# Patient Record
Sex: Male | Born: 1975 | Race: White | Hispanic: No | Marital: Married | Smoking: Current every day smoker
Health system: Southern US, Community
[De-identification: ages and names within clinical notes are randomized; demographics above are authoritative.]

---

## 2003-09-09 ENCOUNTER — Emergency Department (HOSPITAL_COMMUNITY): Admission: EM | Admit: 2003-09-09 | Discharge: 2003-09-09 | Payer: Self-pay | Admitting: Emergency Medicine

## 2003-09-18 ENCOUNTER — Emergency Department (HOSPITAL_COMMUNITY): Admission: AD | Admit: 2003-09-18 | Discharge: 2003-09-18 | Payer: Self-pay | Admitting: Family Medicine

## 2006-10-20 ENCOUNTER — Ambulatory Visit (HOSPITAL_COMMUNITY): Admission: RE | Admit: 2006-10-20 | Discharge: 2006-10-20 | Payer: Self-pay | Admitting: Family Medicine

## 2006-10-26 ENCOUNTER — Ambulatory Visit (HOSPITAL_COMMUNITY): Admission: RE | Admit: 2006-10-26 | Discharge: 2006-10-26 | Payer: Self-pay | Admitting: Family Medicine

## 2006-10-31 ENCOUNTER — Encounter (HOSPITAL_COMMUNITY): Admission: RE | Admit: 2006-10-31 | Discharge: 2006-11-30 | Payer: Self-pay | Admitting: Family Medicine

## 2006-11-17 ENCOUNTER — Ambulatory Visit (HOSPITAL_COMMUNITY): Admission: RE | Admit: 2006-11-17 | Discharge: 2006-11-17 | Payer: Self-pay | Admitting: General Surgery

## 2006-11-17 ENCOUNTER — Encounter (INDEPENDENT_AMBULATORY_CARE_PROVIDER_SITE_OTHER): Payer: Self-pay | Admitting: *Deleted

## 2008-09-02 ENCOUNTER — Ambulatory Visit (HOSPITAL_COMMUNITY): Admission: RE | Admit: 2008-09-02 | Discharge: 2008-09-02 | Payer: Self-pay | Admitting: Family Medicine

## 2010-08-11 IMAGING — CR DG CHEST 2V
2 series · 2 of 2 positions shown · non-contrast
Comparison: None

CLINICAL DATA: Persistent cough, congestion, shortness of breath,
history smoking

CHEST - 2 VIEW

[view not recorded (1 of 2)]
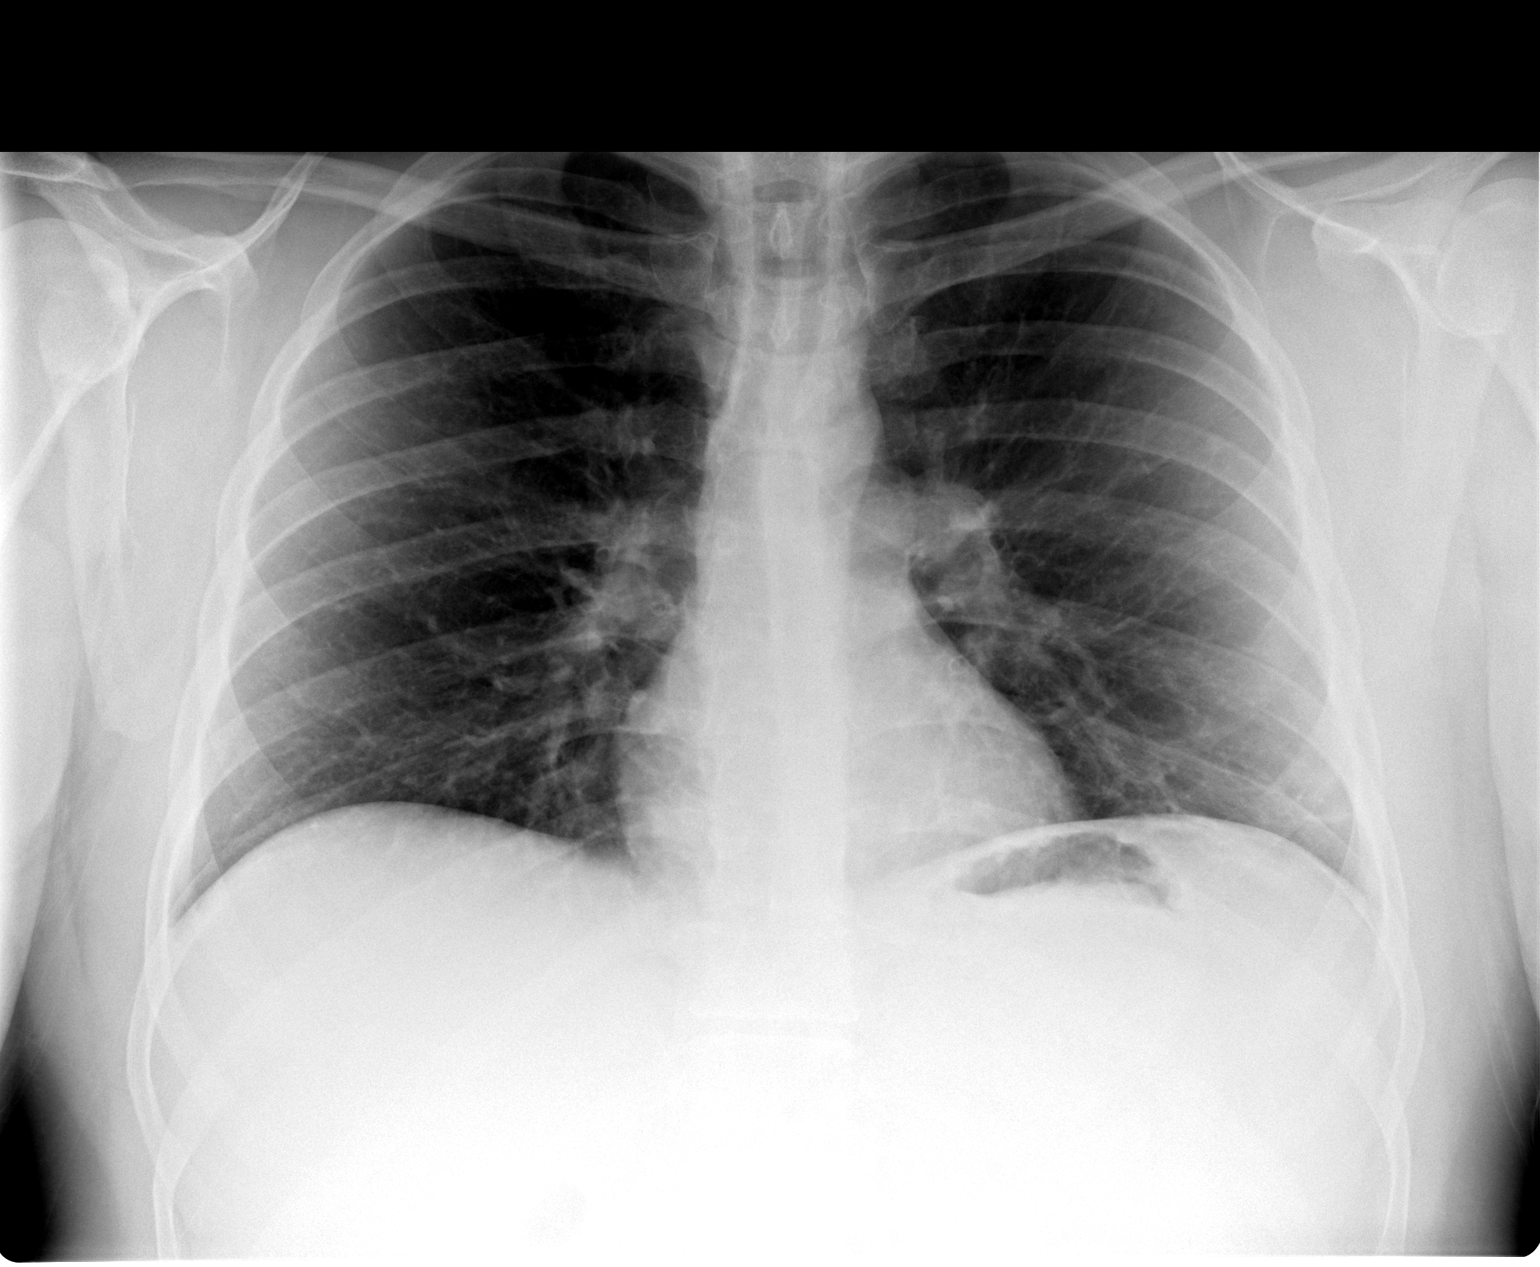

[view not recorded (2 of 2)]
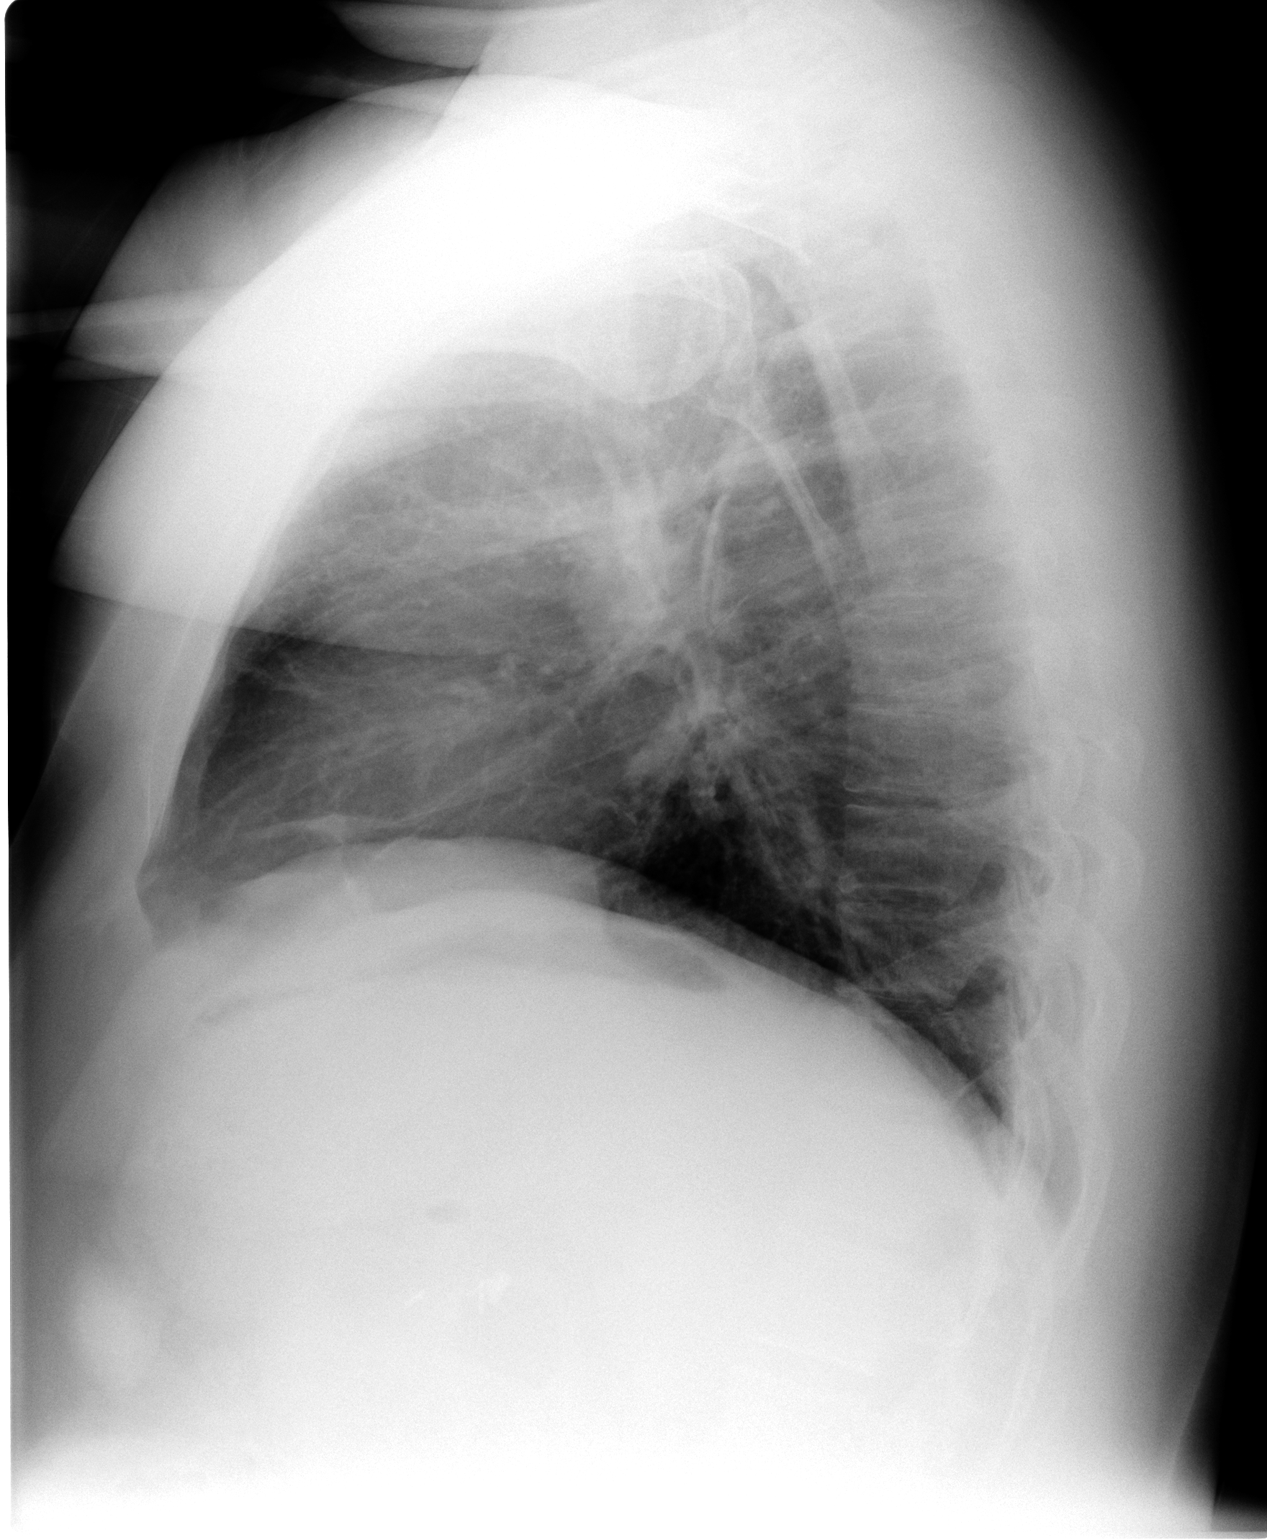

[2 of 2 positions shown; findings below may reference images not displayed]

FINDINGS: Normal heart size, mediastinal contours, and pulmonary vascularity.
Mild peribronchial thickening.
No pulmonary infiltrate, pleural effusion, or pneumothorax.
Bones unremarkable.
Minimal subsegmental atelectasis versus scarring lateral left lung
base.
IMPRESSION: Bronchitic changes.

## 2011-01-14 NOTE — Op Note (Signed)
Melvin Lambert, Melvin Lambert             ACCOUNT NO.:  [id number]   MEDICAL RECORD NO.:  [id number]          PATIENT TYPE:  AMB   LOCATION:  DAY                           FACILITY:  [facility]   PHYSICIAN:  Dalia Heading, M.D.  DATE OF BIRTH:  October 25, 1975   DATE OF PROCEDURE:  11/17/2006  DATE OF DISCHARGE:                               OPERATIVE REPORT   PREOPERATIVE DIAGNOSIS:  Chronic cholecystitis.   POSTOPERATIVE DIAGNOSIS:  Chronic cholecystitis.   PROCEDURE:  Laparoscopic cholecystectomy.   SURGEON:  Dr. Franky Macho   ANESTHESIA:  General endotracheal.   INDICATIONS:  The patient is a 35 year old white male who presents with  chronic cholecystitis.  The risks and benefits of the procedure,  including bleeding, infection, hepatobiliary injury, and the possibility  of an open procedure, were fully explained to the patient, who gave  informed consent.   PROCEDURE NOTE:  The patient was placed in supine position.  After  induction of general endotracheal anesthesia, the abdomen was prepped  and draped using usual sterile technique with Betadine.  Surgical site  confirmation was performed.   A supraumbilical incision was made down to the fascia.  A Veress needle  was introduced into the abdominal cavity and confirmation of placement  was done using the saline drop test.  The abdomen was then insufflated  to 16 mmHg pressure.  An 11-mm trocar was introduced into the abdominal  cavity, under direct visualization, without difficulty.  The patient was  placed in reversed Trendelenburg position.  An additional 11-mm trocar  was placed in the epigastric region and 5-mm trocars placed in right  upper quadrant and right flank regions.  The liver was inspected and  noted to be within normal limits.  The gallbladder was retracted  superior and laterally.  The dissection was begun around the  infundibulum of the gallbladder.  The cystic duct was first identified.  Its juncture to the  infundibulum fully identified.  Endoclips were  placed proximally distally on the cystic duct and the cystic duct was  divided.  This was likewise done on the cystic artery.  The gallbladder  was then freed away from gallbladder fossa, using Bovie electrocautery.  The gallbladder was delivered through the epigastric trocar site, using  an EndoCatch bag.  The gallbladder fossa was inspected and no abnormal  bleeding or bile leakage was noted.  Surgicel was placed in the  gallbladder fossa.  All fluid and air were then evacuate from the  abdominal cavity prior to removal of the trocars.   All wounds were irrigated with normal saline.  All wounds were injected  with 0.5% Sensorcaine.  The supraumbilical fascia was reapproximated  using an 0 Vicryl interrupted suture.  All skin incisions were closed  using staples.  Betadine ointment and dry sterile dressings were  applied.   All tape and needle counts were correct at the end of the procedure.  The patient was extubated in the operating room and went back to the  recovery room, awake in stable condition.   COMPLICATIONS:  None.   SPECIMEN:  Gallbladder.   BLOOD LOSS:  Minimal.      Dalia Heading, M.D.  Electronically Signed     MAJ/MEDQ  D:  11/17/2006  T:  11/17/2006  Job:  [id number]   cc:   Corrie Mckusick, M.D.  Fax: [phone]

## 2011-01-14 NOTE — H&P (Signed)
Melvin Lambert, Melvin Lambert             ACCOUNT NO.:  [id number]   MEDICAL RECORD NO.:  [id number]          PATIENT TYPE:  AMB   LOCATION:  DAY                           FACILITY:  [facility]   PHYSICIAN:  Dalia Heading, M.D.  DATE OF BIRTH:  09-27-1975   DATE OF ADMISSION:  11/14/2006  DATE OF DISCHARGE:  LH                              HISTORY & PHYSICAL   CHIEF COMPLAINT:  Chronic cholecystitis.   HISTORY OF PRESENT ILLNESS:  The patient is a 35 year old white male who  is referred for evaluation and treatment biliary colic secondary to  chronic cholecystitis.  He has been having right upper quadrant  abdominal pain with nausea and bloating for many weeks.  Does have fatty  food intolerance.  No fever, chills, jaundice have been noted.   PAST MEDICAL HISTORY:  Unremarkable.   PAST SURGICAL HISTORY:  Unremarkable.   CURRENT MEDICATIONS:  None.   ALLERGIES:  NO KNOWN DRUG ALLERGIES.   REVIEW OF SYSTEMS:  The patient smokes less than half pack cigarettes a  day.  Denies any significant alcohol use.  He denies any other  cardiopulmonary difficulties or bleeding disorders.   PHYSICAL EXAMINATION:  GENERAL:  The patient is a well-developed, well-  nourished white male in no acute distress.  HEENT:  Examination reveals no scleral icterus.  LUNGS:  Clear to auscultation with equal breath sounds bilaterally.  HEART:  Examination reveals regular rate and rhythm without history, S4,  murmurs.  ABDOMEN:  Soft and nondistended.  He is tender in the right upper  quadrant to palpation.  No hepatosplenomegaly, masses, hernias are  identified.   STUDIES:  A HIDA scan reveals chronic cholecystitis with a low  gallbladder ejection fraction.   IMPRESSION:  Chronic cholecystitis.   PLAN:  The patient is scheduled for laparoscopic cholecystectomy on  November 17, 2006.  The risks and benefits of the procedure including  bleeding, infection, hepatobiliary injury, the possibility of an open  procedure  were fully explained to the patient, gave informed consent.      Dalia Heading, M.D.  Electronically Signed     MAJ/MEDQ  D:  11/14/2006  T:  11/15/2006  Job:  [id number]   cc:   Short Stay at [facility]   Corrie Mckusick, M.D.  Fax: [phone]

## 2018-08-02 ENCOUNTER — Telehealth: Payer: Self-pay | Admitting: Internal Medicine

## 2018-08-02 NOTE — Telephone Encounter (Signed)
Called pt and no answer was received and unable to leave voicemail.

## 2018-08-02 NOTE — Telephone Encounter (Signed)
Per patient his Rx was sent to incorrect Pharmacy.  Patient requests all medications should [facility] in [place]. This is the location at the corner of [address]

## 2019-08-05 ENCOUNTER — Ambulatory Visit (INDEPENDENT_AMBULATORY_CARE_PROVIDER_SITE_OTHER): Payer: 59 | Admitting: Orthopedic Surgery

## 2019-08-05 ENCOUNTER — Other Ambulatory Visit: Payer: Self-pay

## 2019-08-05 ENCOUNTER — Ambulatory Visit (INDEPENDENT_AMBULATORY_CARE_PROVIDER_SITE_OTHER): Payer: 59

## 2019-08-05 ENCOUNTER — Encounter: Payer: Self-pay | Admitting: Orthopedic Surgery

## 2019-08-05 VITALS — Ht 64.0 in | Wt 245.0 lb

## 2019-08-05 DIAGNOSIS — M79671 Pain in right foot: Secondary | ICD-10-CM

## 2019-08-05 NOTE — Progress Notes (Signed)
Office Visit Note   Patient: Melvin Lambert           Date of Birth: 28-Apr-1976           MRN: [id number] Visit Date: 08/05/2019              Requested by: No referring provider defined for this encounter. PCP: System, Pcp Not In  Chief Complaint  Patient presents with  . Right Ankle - Pain, New Patient (Initial Visit)  . Right Foot - Pain, New Patient (Initial Visit)      HPI: This is a pleasant 43 year old gentleman who is seen today for the evaluation of right ankle and foot pain he states that he stepped into a hole about 7 weeks ago and had significant ankle pain he went to an urgent care where x-rays were done and he did not have any fractures.  While the ankle has improved he now has pain across the dorsum of his foot which he describes as an aching and pain in the back of the heel.  He says honestly the ankle is not an issue at this point  Assessment & Plan: Visit Diagnoses:  1. Right foot pain     Plan: He will begin Achilles stretching and I have demonstrated this to him today 3 times a day we also discussed fascial strengthening and proprioceptive exercises .  He does have difficulty getting into the room that he has to wear for work because of the pain on the back of the heel and I have told him to try some Voltaren gel he will follow-up in 3 weeks  Follow-Up Instructions: No follow-ups on file.   Ortho Exam  Patient is alert, oriented, no adenopathy, well-dressed, normal affect, normal respiratory effort. Right ankle: No effusion mild soft tissue swelling he is nontender to palpation over the ankle joint both medially and laterally his pulses are intact he does have some mild tenderness to deep palpation over the dorsum of his foot plantarflexion with resistance recreates the pain in his posterior heel the Achilles is intact  Imaging: No results found. No images are attached to the encounter.  Labs: No results found for: HGBA1C, ESRSEDRATE, CRP, LABURIC,  REPTSTATUS, GRAMSTAIN, CULT, LABORGA   No results found for: ALBUMIN, PREALBUMIN, LABURIC  No results found for: MG No results found for: VD25OH  No results found for: PREALBUMIN No flowsheet data found.   Body mass index is 42.05 kg/m.  Orders:  Orders Placed This Encounter  Procedures  . XR Ankle 2 Views Right  . XR Foot 2 Views Right   No orders of the defined types were placed in this encounter.    Procedures: No procedures performed  Clinical Data: No additional findings.  ROS:  All other systems negative, except as noted in the HPI. Review of Systems  Objective: Vital Signs: Ht 5\' 4"  (1.626 m)   Wt 245 lb (111.1 kg)   BMI 42.05 kg/m   Specialty Comments:  No specialty comments available.  PMFS History: There are no active problems to display for this patient.  No past medical history on file.  No family history on file.   Social History   Occupational History  . Not on file  Tobacco Use  . Smoking status: Never Smoker  . Smokeless tobacco: Never Used  Substance and Sexual Activity  . Alcohol use: Never    Frequency: Never  . Drug use: Never  . Sexual activity: Not on file

## 2022-07-22 ENCOUNTER — Ambulatory Visit
Admission: EM | Admit: 2022-07-22 | Discharge: 2022-07-22 | Disposition: A | Discharge status: Home / Self Care | Payer: No Typology Code available for payment source | Attending: Urgent Care | Admitting: Urgent Care

## 2022-07-22 ENCOUNTER — Ambulatory Visit (INDEPENDENT_AMBULATORY_CARE_PROVIDER_SITE_OTHER): Payer: No Typology Code available for payment source

## 2022-07-22 DIAGNOSIS — M25511 Pain in right shoulder: Secondary | ICD-10-CM | POA: Diagnosis not present

## 2022-07-22 DIAGNOSIS — S46911A Strain of unspecified muscle, fascia and tendon at shoulder and upper arm level, right arm, initial encounter: Secondary | ICD-10-CM | POA: Diagnosis not present

## 2022-07-22 MED ORDER — IBUPROFEN 600 MG PO TABS: 600.0000 mg | ORAL_TABLET | Freq: Four times a day (QID) | ORAL | 0 refills | Status: AC | PRN

## 2022-07-22 MED ORDER — TIZANIDINE HCL 4 MG PO TABS: 4.0000 mg | ORAL_TABLET | Freq: Every day | ORAL | 0 refills | Status: AC

## 2022-07-22 NOTE — ED Provider Notes (Signed)
[facility]  Note:  This document was prepared using Conservation officer, historic buildings and may include unintentional dictation errors.  MRN: [id number] DOB: May 15, 1976  Subjective:   Melvin Lambert is a 46 y.o. male presenting for 1 day history of acute onset persistent right shoulder pain, decreased range of motion, popping.  Symptoms started after he was playing badminton and reached out during the specific play.  Since then he has had this particular symptoms.  No history of shoulder injuries.  Would like to get an x-ray done.  Does not have an orthopedist.  No current facility-administered medications for this encounter. No current outpatient medications on file.   Allergies  Allergen Reactions   Omnicef [Cefdinir] Hives    Patient states he is allergic to this medication in which causes hives.    History reviewed. No pertinent past medical history.   History reviewed. No pertinent surgical history.  No family history on file.  Social History   Tobacco Use   Smoking status: Every Day    Types: Cigarettes   Smokeless tobacco: Never  Substance Use Topics   Alcohol use: Never   Drug use: Never    ROS   Objective:   Vitals: BP 134/89 (BP Location: Left Arm)   Pulse 70   Temp 98.2 F (36.8 C) (Oral)   Resp 16   SpO2 96%   Physical Exam Constitutional:      General: He is not in acute distress.    Appearance: Normal appearance. He is well-developed and normal weight. He is not ill-appearing, toxic-appearing or diaphoretic.  HENT:     Head: Normocephalic and atraumatic.     Right Ear: External ear normal.     Left Ear: External ear normal.     Nose: Nose normal.     Mouth/Throat:     Pharynx: Oropharynx is clear.  Eyes:     General: No scleral icterus.       Right eye: No discharge.        Left eye: No discharge.     Extraocular Movements: Extraocular movements intact.  Cardiovascular:     Rate and Rhythm: Normal rate.   Pulmonary:     Effort: Pulmonary effort is normal.  Musculoskeletal:     Right shoulder: Tenderness (over area outlined) present. No swelling, deformity, effusion, laceration, bony tenderness or crepitus. Decreased range of motion. Normal strength. Normal pulse.       Arms:     Cervical back: Normal range of motion.     Comments: Biceps tendon intact, no ecchymosis, very symmetric to the left arm.  Neurological:     Mental Status: He is alert and oriented to person, place, and time.  Psychiatric:        Mood and Affect: Mood normal.        Behavior: Behavior normal.        Thought Content: Thought content normal.        Judgment: Judgment normal.     Assessment and Plan :   PDMP not reviewed this encounter.  1. Shoulder strain, right, initial encounter   2. Pain in joint of right shoulder     X-ray over-read was pending at time of discharge, recommended follow up with only abnormal results. Otherwise will not call for negative over-read. Patient was in agreement. Will manage for right shoulder strain with resting, icing, limiting his range of motion within reason.  No use of shoulder sling.  Recommended ibuprofen and  tizanidine.  Follow-up with an orthopedist.  Counseled patient on potential for adverse effects with medications prescribed/recommended today, ER and return-to-clinic precautions discussed, patient verbalized understanding.   We will call results to [phone].   Wallis Bamberg, PA-C 07/22/22 1435

## 2022-07-22 NOTE — ED Triage Notes (Signed)
Pt c/o pain to right shoulder after reaching while playing badminton yesterday-NAD-steady gait

## 2022-07-29 ENCOUNTER — Ambulatory Visit: Payer: No Typology Code available for payment source | Admitting: Physician Assistant

## 2022-09-19 ENCOUNTER — Encounter: Payer: Self-pay | Admitting: Physician Assistant

## 2022-09-19 ENCOUNTER — Ambulatory Visit (INDEPENDENT_AMBULATORY_CARE_PROVIDER_SITE_OTHER): Payer: No Typology Code available for payment source | Admitting: Physician Assistant

## 2022-09-19 DIAGNOSIS — M25511 Pain in right shoulder: Secondary | ICD-10-CM | POA: Diagnosis not present

## 2022-09-19 MED ORDER — LIDOCAINE HCL 1 % IJ SOLN
2.0000 mL | INTRAMUSCULAR | Status: AC | PRN
  Administered 2022-09-19: 2 mL

## 2022-09-19 MED ORDER — METHYLPREDNISOLONE ACETATE 40 MG/ML IJ SUSP
80.0000 mg | INTRAMUSCULAR | Status: AC | PRN
  Administered 2022-09-19: 80 mg via INTRA_ARTICULAR

## 2022-09-19 MED ORDER — BUPIVACAINE HCL 0.25 % IJ SOLN
2.0000 mL | INTRAMUSCULAR | Status: AC | PRN
  Administered 2022-09-19: 2 mL via INTRA_ARTICULAR

## 2022-09-19 NOTE — Progress Notes (Signed)
Office Visit Note   Patient: Melvin Lambert           Date of Birth: November 04, 1975           MRN: [id number] Visit Date: 09/19/2022              Requested by: No referring provider defined for this encounter. PCP: Pcp, No  Chief Complaint  Patient presents with  . Right Shoulder - Pain      HPI: Kirk a pleasant 47 year old gentleman with a 40-month history of right shoulder pain.  He states this began after he was playing badminton and attempted to hit and miss the badminton caulk.  He felt like he had a pop.  Since then he has 3-4 episodes a day of having painful clicking in his shoulder.  Does not really take anything for it.  Assessment & Plan: Visit Diagnoses: Right shoulder pain  Plan: His exam is fairly benign today he has good strength and good motion.  Cannot reproduce his painful symptoms.  Thoughts are some rotator cuff irritation though given his age could also have a labral pathology.  Will go forward with an injection today subacromial see if this helps him.  Can also do some rotator cuff exercises which I gave him.  I would give this another 4 weeks.  If he is not better he will contact me and we can order an MRI of the shoulder  Follow-Up Instructions: Return in about 4 weeks (around 10/17/2022).   Ortho Exam  Patient is alert, oriented, no adenopathy, well-dressed, normal affect, normal respiratory effort. Examination he has full forward elevation both actively and passively slight decrease in internal rotation behind his back compared to the unaffected side.  He is neurovascular intact grip strength is intact pulses are intact.  He has a negative apprehension sign.  Negative empty can test negative speeds sign.  Strength is 5 out of 5 with resisted abduction external and internal rotation  Imaging: No results found. No images are attached to the encounter.  Labs: No results found for: "HGBA1C", "ESRSEDRATE", "CRP", "LABURIC", "REPTSTATUS", "GRAMSTAIN", "CULT",  "LABORGA"   No results found for: "ALBUMIN", "PREALBUMIN", "CBC"  No results found for: "MG" No results found for: "VD25OH"  No results found for: "PREALBUMIN"     No data to display           There is no height or weight on file to calculate BMI.  Orders:  No orders of the defined types were placed in this encounter.  No orders of the defined types were placed in this encounter.    Procedures: Large Joint Inj: R subacromial bursa on 09/19/2022 4:28 PM Indications: diagnostic evaluation and pain Details: 25 G 1.5 in needle, posterior approach  Arthrogram: No  Medications: 2 mL lidocaine 1 %; 80 mg methylPREDNISolone acetate 40 MG/ML; 2 mL bupivacaine 0.25 % Outcome: tolerated well, no immediate complications Procedure, treatment alternatives, risks and benefits explained, specific risks discussed. Consent was given by the patient.    Clinical Data: No additional findings.  ROS:  All other systems negative, except as noted in the HPI. Review of Systems  Objective: Vital Signs: There were no vitals taken for this visit.  Specialty Comments:  No specialty comments available.  PMFS History: There are no problems to display for this patient.  No past medical history on file.  No family history on file.  No past surgical history on file. Social History   Occupational History  .  Not on file  Tobacco Use  . Smoking status: Every Day    Types: Cigarettes  . Smokeless tobacco: Never  Vaping Use  . Vaping Use: Not on file  Substance and Sexual Activity  . Alcohol use: Never  . Drug use: Never  . Sexual activity: Not on file

## 2023-08-14 ENCOUNTER — Ambulatory Visit (HOSPITAL_BASED_OUTPATIENT_CLINIC_OR_DEPARTMENT_OTHER): Payer: No Typology Code available for payment source | Admitting: Student

## 2023-08-15 ENCOUNTER — Ambulatory Visit: Payer: No Typology Code available for payment source | Admitting: Physician Assistant

## 2024-01-18 ENCOUNTER — Ambulatory Visit (HOSPITAL_BASED_OUTPATIENT_CLINIC_OR_DEPARTMENT_OTHER)

## 2024-01-18 ENCOUNTER — Encounter (HOSPITAL_BASED_OUTPATIENT_CLINIC_OR_DEPARTMENT_OTHER): Payer: Self-pay | Admitting: Student

## 2024-01-18 ENCOUNTER — Ambulatory Visit (INDEPENDENT_AMBULATORY_CARE_PROVIDER_SITE_OTHER): Admitting: Student

## 2024-01-18 DIAGNOSIS — M25561 Pain in right knee: Secondary | ICD-10-CM | POA: Diagnosis not present

## 2024-01-18 MED ORDER — LIDOCAINE HCL 1 % IJ SOLN
4.0000 mL | INTRAMUSCULAR | Status: AC | PRN
  Administered 2024-01-18: 4 mL

## 2024-01-18 MED ORDER — TRIAMCINOLONE ACETONIDE 40 MG/ML IJ SUSP
2.0000 mL | INTRAMUSCULAR | Status: AC | PRN
  Administered 2024-01-18: 2 mL via INTRA_ARTICULAR

## 2024-01-18 NOTE — Progress Notes (Signed)
 Chief Complaint: Right knee pain     History of Present Illness:    Melvin Lambert is a 48 y.o. male presenting today for evaluation of right knee pain.  Patient states that this began approximately 1 week ago without any known injury.  Pain is located over the front of the knee and worsens with activities such as going up or down stairs.  No history of injury or surgery to this knee, however he does describe that he experienced bursitis approximately 7 years ago.  He has tried ibuprofen  without any significant relief.   Surgical History:   None  PMH/PSH/Family History/Social History/Meds/Allergies:   History reviewed. No pertinent past medical history. History reviewed. No pertinent surgical history. Social History   Socioeconomic History   Marital status: Married    Spouse name: Not on file   Number of children: Not on file   Years of education: Not on file   Highest education level: Not on file  Occupational History   Not on file  Tobacco Use   Smoking status: Every Day    Types: Cigarettes   Smokeless tobacco: Never  Vaping Use   Vaping status: Not on file  Substance and Sexual Activity   Alcohol use: Never   Drug use: Never   Sexual activity: Not on file  Other Topics Concern   Not on file  Social History Narrative   Not on file   Social Drivers of Health   Financial Resource Strain: Not on file  Food Insecurity: Not on file  Transportation Needs: Not on file  Physical Activity: Not on file  Stress: Not on file  Social Connections: Not on file   History reviewed. No pertinent family history. Allergies  Allergen Reactions   Omnicef [Cefdinir] Hives    Patient states he is allergic to this medication in which causes hives.   Current Outpatient Medications  Medication Sig Dispense Refill   ibuprofen  (ADVIL ) 600 MG tablet Take 1 tablet (600 mg total) by mouth every 6 (six) hours as needed. 30 tablet 0   tiZANidine   (ZANAFLEX ) 4 MG tablet Take 1 tablet (4 mg total) by mouth at bedtime. 30 tablet 0   No current facility-administered medications for this visit.   No results found.  Review of Systems:   A ROS was performed including pertinent positives and negatives as documented in the HPI.  Physical Exam :   Constitutional: NAD and appears stated age Neurological: Alert and oriented Psych: Appropriate affect and cooperative There were no vitals taken for this visit.   Comprehensive Musculoskeletal Exam:    Right knee exam demonstrates active range of motion from 0 to 120 degrees without any palpable crepitus.  No overlying erythema or warmth.  No significant effusion present.  Stable collaterals with varus or valgus stress.  Knee flexion and extension strength is 5/5.  Imaging:   Xray (right knee 4 views): Negative for acute fracture or dislocation.  Tibiofemoral joint spaces are well-maintained.  Minimal to mild spurring noted within the patellofemoral compartment.   I personally reviewed and interpreted the radiographs.   Assessment:   48 y.o. male with 1 week history of atraumatic right knee pain.  Pain is located in the anterior knee and is worsened going up or down stairs.  X-rays today are negative other than  mild isolated patellofemoral degenerative changes.  Discussed that his symptoms today do appear consistent with patellofemoral pain, although unsure if the mild degree of degenerative changes are responsible versus some potential patellofemoral syndrome.  Offered a diagnostic cortisone injection into the right knee joint which patient has agreed to proceed with.  Injection was performed today without any complication.  Can plan to assess if he does get relief with this and can see him back as needed.  If pain persist, may consider HEP versus referral to physical therapy.  Plan :    - Right knee cortisone injection performed today after patient consent - Return to clinic as  needed    Procedure Note  Patient: Melvin Lambert             Date of Birth: 1976/05/06           MRN: [id number]             Visit Date: 01/18/2024  Procedures: Visit Diagnoses:  1. Acute pain of right knee     Large Joint Inj: R knee on 01/18/2024 4:55 PM Indications: pain Details: 22 G 1.5 in needle, anterolateral approach Medications: 4 mL lidocaine  1 %; 2 mL triamcinolone acetonide 40 MG/ML Outcome: tolerated well, no immediate complications Procedure, treatment alternatives, risks and benefits explained, specific risks discussed. Consent was given by the patient. Immediately prior to procedure a time out was called to verify the correct patient, procedure, equipment, support staff and site/side marked as required. Patient was prepped and draped in the usual sterile fashion.       I personally saw and evaluated the patient, and participated in the management and treatment plan.  Sharrell Deck, PA-C Orthopedics

## 2024-07-01 ENCOUNTER — Encounter: Payer: Self-pay | Admitting: Radiology
